# Patient Record
Sex: Male | Born: 1955 | Race: Black or African American | Hispanic: No | Marital: Single | Smoking: Former smoker
Health system: Southern US, Community
[De-identification: ages and names within clinical notes are randomized; demographics above are authoritative.]

---

## 1997-12-17 ENCOUNTER — Ambulatory Visit (HOSPITAL_COMMUNITY): Admission: RE | Admit: 1997-12-17 | Discharge: 1997-12-17 | Payer: Self-pay | Admitting: *Deleted

## 1998-02-11 ENCOUNTER — Ambulatory Visit (HOSPITAL_COMMUNITY): Admission: RE | Admit: 1998-02-11 | Discharge: 1998-02-11 | Payer: Self-pay | Admitting: Neurosurgery

## 1998-03-31 ENCOUNTER — Observation Stay (HOSPITAL_COMMUNITY): Admission: RE | Admit: 1998-03-31 | Discharge: 1998-04-01 | Payer: Self-pay | Admitting: Neurosurgery

## 1998-04-22 ENCOUNTER — Ambulatory Visit (HOSPITAL_COMMUNITY): Admission: RE | Admit: 1998-04-22 | Discharge: 1998-04-22 | Payer: Self-pay | Admitting: Neurosurgery

## 1998-04-22 ENCOUNTER — Encounter: Payer: Self-pay | Admitting: Neurosurgery

## 1998-05-18 ENCOUNTER — Encounter: Payer: Self-pay | Admitting: Neurosurgery

## 1998-05-18 ENCOUNTER — Ambulatory Visit (HOSPITAL_COMMUNITY): Admission: RE | Admit: 1998-05-18 | Discharge: 1998-05-18 | Payer: Self-pay | Admitting: Neurosurgery

## 2015-02-10 ENCOUNTER — Inpatient Hospital Stay
Admission: RE | Admit: 2015-02-10 | Discharge: 2015-03-07 | Disposition: A | Discharge status: Home Health | Payer: Medicare HMO | Source: Ambulatory Visit | Attending: Internal Medicine | Admitting: Internal Medicine

## 2015-02-10 DIAGNOSIS — J939 Pneumothorax, unspecified: Secondary | ICD-10-CM

## 2015-02-10 DIAGNOSIS — J942 Hemothorax: Secondary | ICD-10-CM

## 2015-02-10 DIAGNOSIS — J9690 Respiratory failure, unspecified, unspecified whether with hypoxia or hypercapnia: Secondary | ICD-10-CM

## 2015-02-10 DIAGNOSIS — R0602 Shortness of breath: Secondary | ICD-10-CM

## 2015-02-10 DIAGNOSIS — J90 Pleural effusion, not elsewhere classified: Secondary | ICD-10-CM

## 2015-02-10 DIAGNOSIS — Z4682 Encounter for fitting and adjustment of non-vascular catheter: Secondary | ICD-10-CM

## 2015-02-10 DIAGNOSIS — Z9689 Presence of other specified functional implants: Secondary | ICD-10-CM

## 2015-02-11 ENCOUNTER — Other Ambulatory Visit (HOSPITAL_COMMUNITY): Payer: Self-pay

## 2015-02-11 LAB — COMPREHENSIVE METABOLIC PANEL
ALBUMIN: 2.5 g/dL — AB (ref 3.5–5.0)
ALK PHOS: 99 U/L (ref 38–126)
ALT: 19 U/L (ref 17–63)
ANION GAP: 7 (ref 5–15)
AST: 24 U/L (ref 15–41)
BUN: 7 mg/dL (ref 6–20)
CO2: 25 mmol/L (ref 22–32)
CREATININE: 0.81 mg/dL (ref 0.61–1.24)
Calcium: 9.3 mg/dL (ref 8.9–10.3)
Chloride: 102 mmol/L (ref 101–111)
GFR calc non Af Amer: >60 mL/min (ref >60)
GLUCOSE: 104 mg/dL — AB (ref 65–99)
Potassium: 3.8 mmol/L (ref 3.5–5.1)
SODIUM: 134 mmol/L — AB (ref 135–145)
Total Bilirubin: 0.7 mg/dL (ref 0.3–1.2)
Total Protein: 7.1 g/dL (ref 6.5–8.1)

## 2015-02-11 LAB — CBC
HEMATOCRIT: 38.8 % — AB (ref 39.0–52.0)
HEMOGLOBIN: 12.9 g/dL — AB (ref 13.0–17.0)
MCH: 30.6 pg (ref 26.0–34.0)
MCHC: 33.2 g/dL (ref 30.0–36.0)
MCV: 91.9 fL (ref 78.0–100.0)
PLATELETS: 193 10*3/uL (ref 150–400)
RBC: 4.22 MIL/uL (ref 4.22–5.81)
RDW: 12.8 % (ref 11.5–15.5)
WBC: 5.2 10*3/uL (ref 4.0–10.5)

## 2015-02-11 LAB — TSH: TSH: 1.342 u[IU]/mL (ref 0.350–4.500)

## 2015-02-14 ENCOUNTER — Other Ambulatory Visit (HOSPITAL_COMMUNITY): Payer: Self-pay

## 2015-02-14 LAB — CBC
HCT: 39.3 % (ref 39.0–52.0)
Hemoglobin: 13.1 g/dL (ref 13.0–17.0)
MCH: 30.7 pg (ref 26.0–34.0)
MCHC: 33.3 g/dL (ref 30.0–36.0)
MCV: 92 fL (ref 78.0–100.0)
PLATELETS: 185 10*3/uL (ref 150–400)
RBC: 4.27 MIL/uL (ref 4.22–5.81)
RDW: 12.8 % (ref 11.5–15.5)
WBC: 3.6 10*3/uL — ABNORMAL LOW (ref 4.0–10.5)

## 2015-02-14 LAB — BASIC METABOLIC PANEL
ANION GAP: 5 (ref 5–15)
BUN: 10 mg/dL (ref 6–20)
CHLORIDE: 106 mmol/L (ref 101–111)
CO2: 23 mmol/L (ref 22–32)
Calcium: 9.1 mg/dL (ref 8.9–10.3)
Creatinine, Ser: 0.74 mg/dL (ref 0.61–1.24)
GFR calc Af Amer: >60 mL/min (ref >60)
GFR calc non Af Amer: >60 mL/min (ref >60)
Glucose, Bld: 106 mg/dL — ABNORMAL HIGH (ref 65–99)
Potassium: 3.8 mmol/L (ref 3.5–5.1)
Sodium: 134 mmol/L — ABNORMAL LOW (ref 135–145)

## 2015-02-16 DIAGNOSIS — R0902 Hypoxemia: Secondary | ICD-10-CM

## 2015-02-16 DIAGNOSIS — J441 Chronic obstructive pulmonary disease with (acute) exacerbation: Secondary | ICD-10-CM | POA: Diagnosis not present

## 2015-02-16 DIAGNOSIS — Z789 Other specified health status: Secondary | ICD-10-CM

## 2015-02-16 NOTE — Consult Note (Signed)
Name: Glen Estrada MRN: [id number] DOB: 04-15-1956    ADMISSION DATE:  02/10/2015 CONSULTATION DATE:  7/27  REFERRING MD :  Sharyon Medicus  CHIEF COMPLAINT:  Pneumothorax   BRIEF PATIENT DESCRIPTION:   This is a 59 year old patient [facility] after being discharged from outside hospital where he was admitted for left-sided rib fractures and traumatic pneumothorax following a fall while intoxicated. This was treated initially with chest tube insertion by thoracic surgery. One chest tube was removed, however his anterior chest tube remained due to ongoing air leak. He was deemed to be not a surgical candidate.[facility] management of pneumothorax.  SIGNIFICANT EVENTS    STUDIES:     HISTORY OF PRESENT ILLNESS:     PAST MEDICAL HISTORY :  Alcohol abuse, severe chronic obstructive pulmonary disease, facial fractures following a fall, hypertension  Allergies not on file  FAMILY HISTORY:  family history is not on file. SOCIAL HISTORY:  lives in [place]. Positive history of EtOH and smoking  REVIEW OF SYSTEMS:   Constitutional: Negative for fever, chills, weight loss, malaise/fatigue and diaphoresis.  HENT: Negative for hearing loss, ear pain, nosebleeds, congestion, sore throat, neck pain, tinnitus and ear discharge.   Eyes: Negative for blurred vision, double vision, photophobia, pain, discharge and redness.  Respiratory: Negative for cough, hemoptysis, sputum production, he does have shortness of breath, mostly associated with pain, this was resolved once chest tube was placed to suction wheezing and stridor.   Cardiovascular: Negative for chest pain, palpitations, orthopnea, claudication, leg swelling and PND.  Gastrointestinal: Negative for heartburn, nausea, vomiting, abdominal pain, diarrhea, constipation, blood in stool and melena.  Genitourinary: Negative for dysuria, urgency, frequency, hematuria and flank  pain.  Musculoskeletal: Negative for myalgias, back pain, joint pain and falls.  Skin: Negative for itching and rash.  Neurological: Negative for dizziness, tingling, tremors, sensory change, speech change, focal weakness, seizures, loss of consciousness, weakness and headaches.  Endo/Heme/Allergies: Negative for environmental allergies and polydipsia. Does not bruise/bleed easily.  SUBJECTIVE:  No acute distress VITAL SIGNS: 98.7, heart rate 78 respirations 20 blood pressure 140/82 saturations and 99%room-air PHYSICAL EXAMINATION: General:  59 year old male patient currently sitting up in bed in no acute distress Neuro:  Awake alert no focal deficits HEENT:  Normal cephalic atraumatic no JVD Cardiovascular:  Heart regular rate and rhythm Lungs:  Left greater than right rhonchi. Left chest tube with transient 2/6 airleak; after application of 20 cm H2O suction Abdomen:  Soft nontender Musculoskeletal: intact Skin:  intact   Recent Labs Lab 02/11/15 0538 02/14/15 0438  NA 134* 134*  K 3.8 3.8  CL 102 106  CO2 25 23  BUN 7 10  CREATININE 0.81 0.74  GLUCOSE 104* 106*    Recent Labs Lab 02/11/15 0538 02/14/15 0438  HGB 12.9* 13.1  HCT 38.8* 39.3  WBC 5.2 3.6*  PLT 193 185   No results found.  ASSESSMENT / PLAN:  Traumatic left-sided pneumothorax With associated left-sided chest pain COPD  Discussion He has significant underlying chronic obstructive pulmonary disease and slow to resolve pneumothorax. Has been on water seal with persistent pneumothorax. Patient now placed to 20 cm suction, observed transient 2/6 air leak, with resolution of chest discomfort following application of suction.  Plan Continue current chest tube at 20 cm suction Followup chest x-ray 7/28, if no pneumothorax on chest x-ray, and also no airleak will then repeat attempt waterseal to assure resolution of  pneumothorax.  Simonne Martinet ACNP-BC [facility] Pulmonary/Critical Care Pager #  [phone] OR # [phone] if no answer'  Attending note:  59 year old male with history of COPD s/p a traumatic PTX who presents to [facility] for rehab and chest tube management.  C/O CP.  Decreased BS on the right on exam prior placement of CT to suction.  Improved with 20 cm of water suction.  I reviewed the CXR myself and chest tube is in proper position.  Discussed with [facility] and PCCM-NP.  PTX:  - Place chest tube on suction.  - CXR in AM.  - If CXR is improved may place back to water seal assuming no recurrence of the PTX.  - If 7/29 CXR normal while on water seal may remove chest tube.  COPD:  - BD as ordered.  - Smoking cessation.  Hypoxemia:  - Supplemental O2 as needed.  - Will need an ambulatory desaturation study prior to discharge to determine if will need home O2.  - Titrate O2 for sat of 88-92%.  Chest Pain: due to PTX.  - Chest tube management as above.  Patient seen and examined, agree with above note.  I dictated the care and orders written for this patient under my direction.  Alyson Reedy, MD [phone]  02/16/2015, 12:29 PM

## 2015-02-17 ENCOUNTER — Other Ambulatory Visit (HOSPITAL_COMMUNITY): Payer: Self-pay

## 2015-02-17 DIAGNOSIS — J942 Hemothorax: Secondary | ICD-10-CM | POA: Diagnosis not present

## 2015-02-17 DIAGNOSIS — Z4682 Encounter for fitting and adjustment of non-vascular catheter: Secondary | ICD-10-CM | POA: Diagnosis not present

## 2015-02-17 DIAGNOSIS — J939 Pneumothorax, unspecified: Secondary | ICD-10-CM | POA: Diagnosis not present

## 2015-02-17 DIAGNOSIS — J9600 Acute respiratory failure, unspecified whether with hypoxia or hypercapnia: Secondary | ICD-10-CM | POA: Diagnosis not present

## 2015-02-17 DIAGNOSIS — J9690 Respiratory failure, unspecified, unspecified whether with hypoxia or hypercapnia: Secondary | ICD-10-CM | POA: Insufficient documentation

## 2015-02-17 NOTE — Progress Notes (Signed)
Name: Glen Estrada MRN: [id number] DOB: [birth year]    ADMISSION DATE:  02/10/2015 CONSULTATION DATE:  7/27  REFERRING MD :  Sharyon Medicus  CHIEF COMPLAINT:  Pneumothorax   BRIEF PATIENT DESCRIPTION:   This is a 59 year old patient was [facility] after being discharged from outside hospital where he was admitted for left-sided rib fractures and traumatic pneumothorax following a fall while intoxicated. This was treated initially with chest tube insertion by thoracic surgery. One chest tube was removed, however his anterior chest tube remained due to ongoing air leak. He was deemed to be not a surgical candidate.[facility] of pneumothorax.  SIGNIFICANT EVENTS    STUDIES:    SUBJECTIVE:  No distress  VITAL SIGNS: 98.2 hr 74 rr 18 bp 130/78 stas 95% room air   PHYSICAL EXAMINATION: General:  59 year old male patient currently sitting up in bed in no acute distress Neuro:  Awake alert no focal deficits HEENT:  Normal cephalic atraumatic no JVD Cardiovascular:  Heart regular rate and rhythm Lungs:  Left greater than right rhonchi. Left chest tube with no airleak on 20 cm sxn; slowly improving Jessamine air over right chest remains Abdomen:  Soft nontender Musculoskeletal: intact Skin:  intact   Recent Labs Lab 02/11/15 0538 02/14/15 0438  NA 134* 134*  K 3.8 3.8  CL 102 106  CO2 25 23  BUN 7 10  CREATININE 0.81 0.74  GLUCOSE 104* 106*    Recent Labs Lab 02/11/15 0538 02/14/15 0438  HGB 12.9* 13.1  HCT 38.8* 39.3  WBC 5.2 3.6*  PLT 193 185   Dg Chest Port 1 View  02/17/2015   CLINICAL DATA:  Recent pneumothorax with chest tube placement  EXAM: PORTABLE CHEST - 1 VIEW  COMPARISON:  February 14, 2015  FINDINGS: Chest tube position on the left is unchanged. Left-sided pneumothorax is smaller with only a rather minimal apical component currently. There is subcutaneous air bilaterally. There is again noted  bullous disease in the upper lobes, more on the left than on the right. There is mild bibasilar atelectasis. Lungs elsewhere clear. Heart size is normal. The pulmonary vascularity reflects the underlying emphysematous change. No adenopathy.  IMPRESSION: Chest tube remains on the left with left apical pneumothorax smaller compared to most recent prior study. Bullous disease in the upper lobes, more on the left than on the right, stable. There is mild bibasilar atelectasis. No new opacity. No change in cardiac silhouette.   Electronically Signed   By: Bretta Bang III M.D.   On: 02/17/2015 07:56  still has small apical ptx on review of today's film   ASSESSMENT / PLAN:  Traumatic left-sided pneumothorax With associated left-sided chest pain COPD  Discussion He has significant underlying chronic obstructive pulmonary disease and slow to resolve pneumothorax.  No airleak on 20 cm H2O sxn. Not sure if the remaining small apical ptx is active PTX or simply lung that will not fully expand.   Plan Change to water seal Followup chest x-ray 7/29 if still no airleak and PTX  W/out change will assess for CT removal. I  Simonne Martinet ACNP-BC [facility] Pulmonary/Critical Care Pager # [phone] OR # [phone] if no answer'   STAFF NOTE: I, Rory Percy, MD FACP have personally reviewed patient's available data, including medical history, events of note, physical examination and test results as part of my evaluation. I have discussed with resident/NP and other care providers such as  pharmacist, RN and RRT. In addition, I personally evaluated patient and elicited key findings of: CTA distant, no distress now, does develop pain with PTX, to water seal now as lung seams to not have changed, he seems to have somewhat noncompliant lung dz, we may have a small space that lung does not re expand, would water seal today, pcxr this afternoon, follow symptoms closely   Mcarthur Rossetti. Tyson Alias, MD, FACP Pgr:  [phone] [place] [facility] 02/17/2015 7:36 PM

## 2015-02-18 ENCOUNTER — Other Ambulatory Visit (HOSPITAL_COMMUNITY): Payer: Self-pay

## 2015-02-18 LAB — CBC
HCT: 39.5 % (ref 39.0–52.0)
HEMOGLOBIN: 13.2 g/dL (ref 13.0–17.0)
MCH: 30.6 pg (ref 26.0–34.0)
MCHC: 33.4 g/dL (ref 30.0–36.0)
MCV: 91.4 fL (ref 78.0–100.0)
Platelets: 170 10*3/uL (ref 150–400)
RBC: 4.32 MIL/uL (ref 4.22–5.81)
RDW: 12.5 % (ref 11.5–15.5)
WBC: 6.1 10*3/uL (ref 4.0–10.5)

## 2015-02-18 LAB — BASIC METABOLIC PANEL
Anion gap: 6 (ref 5–15)
BUN: 9 mg/dL (ref 6–20)
CALCIUM: 9.1 mg/dL (ref 8.9–10.3)
CO2: 26 mmol/L (ref 22–32)
Chloride: 101 mmol/L (ref 101–111)
Creatinine, Ser: 0.82 mg/dL (ref 0.61–1.24)
GFR calc non Af Amer: >60 mL/min (ref >60)
Glucose, Bld: 99 mg/dL (ref 65–99)
POTASSIUM: 4 mmol/L (ref 3.5–5.1)
Sodium: 133 mmol/L — ABNORMAL LOW (ref 135–145)

## 2015-02-21 ENCOUNTER — Institutional Professional Consult (permissible substitution) (HOSPITAL_COMMUNITY): Payer: Self-pay

## 2015-02-21 DIAGNOSIS — J939 Pneumothorax, unspecified: Secondary | ICD-10-CM | POA: Diagnosis not present

## 2015-02-21 DIAGNOSIS — J942 Hemothorax: Secondary | ICD-10-CM | POA: Diagnosis not present

## 2015-02-21 NOTE — Progress Notes (Signed)
   Name: Glen Estrada MRN: [id number] DOB: 03/27/1956    ADMISSION DATE:  02/10/2015 CONSULTATION DATE:  7/27  REFERRING MD :  Sharyon Medicus  CHIEF COMPLAINT:  Pneumothorax   BRIEF PATIENT DESCRIPTION:   This is a 59 year old patient was [facility] after being discharged from outside hospital where he was admitted for left-sided rib fractures and traumatic pneumothorax following a fall while intoxicated. This was treated initially with chest tube insertion by thoracic surgery. One chest tube was removed, however his anterior chest tube remained due to ongoing air leak. He was deemed to be not a surgical candidate.[facility] of pneumothorax.  SIGNIFICANT EVENTS    STUDIES:    SUBJECTIVE:  No distress  VITAL SIGNS: Reviewed at bedside, abnormal values addressed in A/P  PHYSICAL EXAMINATION: General:  59 year old male patient currently sitting up in chair in no acute distress Neuro:  Awake alert no focal deficits HEENT:  Normal cephalic atraumatic no JVD Cardiovascular:  Heart regular rate and rhythm Lungs:  Left greater than right rhonchi. Left chest tube with no airleak on water seal; minimal SQ air on chest remains Abdomen:  Soft nontender Musculoskeletal: intact Skin:  intact   Recent Labs Lab 02/18/15 0543  NA 133*  K 4.0  CL 101  CO2 26  BUN 9  CREATININE 0.82  GLUCOSE 99    Recent Labs Lab 02/18/15 0543  HGB 13.2  HCT 39.5  WBC 6.1  PLT 170   No results found.still has small apical ptx on 7/29 film   ASSESSMENT / PLAN:  Traumatic left-sided pneumothorax With associated left-sided chest pain - s/p L chest tube COPD  Discussion He has significant underlying chronic obstructive pulmonary disease and slow to resolve pneumothorax.  No airleak onwater seal but still with small apical ptx on 7/29 CXR. Not sure if the remaining small apical ptx is active PTX or simply lung that will not  fully expand.   Plan Cont CT to water seal  F/u CXR now  If no remaining or unchanged PTX consider d/c chest tube    Dirk Dress, NP 02/21/2015  11:25 AM Pager: [phone] or [phone]  Attending:  I have seen and examined the patient with nurse practitioner/resident and agree with the note above.   He feels well on water seal, no chest pain CXR from Friday reviewed none from today.  Hard to know if the apical pneumothorax is a bleb or not (after reviewing old CT scans0  Plan repeat CXR in AM, if unchanged will remove chest tube and monitor carefully  Heber Raynham, MD [place] [facility] Pager: [phone] Cell: [phone] After 3pm or if no response, call [phone]

## 2015-02-22 ENCOUNTER — Other Ambulatory Visit (HOSPITAL_COMMUNITY): Payer: Self-pay

## 2015-02-22 LAB — PROTIME-INR
INR: 1.35 (ref 0.00–1.49)
PROTHROMBIN TIME: 16.8 s — AB (ref 11.6–15.2)

## 2015-02-22 NOTE — Progress Notes (Signed)
S:  Called to bedside to pull left chest tube. Dr. Laren Everts indicates resistance when attempting to pull remaining L chest tube.  Sutures clipped prior to arrival.    O:  Patient awake/alert, NAD Speech clear, MAE L chest tube in position.  No drainage or airleak from site.  Able to rotate tube almost 360 degrees without resistance.  However, tube will move approx 1 cm but significant resistance met when attempting to pull.  Applied gentle pressure but tube unable to be removed.     Assessment / Plan: CT Chest to evaluate chest tube placement.  Concerned that it has been in so long that there may be pleural adhesions.   Pending CT review, consider CVTS evaluation of chest tube removal.   Check PT/INR, platelets  Noe Gens, NP-C [place] [facility] Pgr: [phone] or if no answer [phone] 02/22/2015, 2:53 PM

## 2015-02-23 DIAGNOSIS — J939 Pneumothorax, unspecified: Secondary | ICD-10-CM

## 2015-02-23 DIAGNOSIS — J942 Hemothorax: Secondary | ICD-10-CM | POA: Diagnosis not present

## 2015-02-23 NOTE — Progress Notes (Signed)
Name: Glen Estrada MRN: [id number] DOB: January 31, 1956    ADMISSION DATE:  02/10/2015 CONSULTATION DATE:  7/27  REFERRING MD :  Laren Everts  CHIEF COMPLAINT:  Pneumothorax   BRIEF PATIENT DESCRIPTION: 59 year old patient [facility] after being discharged from outside hospital where he was admitted for left-sided rib fractures and traumatic pneumothorax following a fall while intoxicated. This was treated initially with chest tube insertion by thoracic surgery. One chest tube was removed, however his anterior chest tube remained due to ongoing air leak. He was deemed to be not a surgical candidate.[facility]orax.  SIGNIFICANT EVENTS  7/21  Admit  8/02  Attempted to remove L chest tube but met significant resistance 8/02  Seen by Dr. Prescott Gum and he will take out in the OR, planned for next week  STUDIES:    SUBJECTIVE:  Pt reports significant pain from chest tube site.  Dr. Prescott Gum attempted removal last PM as well.  Unable to remove.  Plan for OR next week with conscious sedation and local anesthesia.    VITAL SIGNS: Reviewed at bedside, abnormal values addressed in A/P  PHYSICAL EXAMINATION: General:  male patient currently sitting up in chair in no acute distress Neuro:  Awake alert no focal deficits HEENT:  Normal cephalic atraumatic no JVD Cardiovascular:  Heart regular rate and rhythm Lungs:  Left greater than right rhonchi. Left chest tube with no airleak on water seal Abdomen:  Soft nontender Musculoskeletal: intact Skin:  intact   Recent Labs Lab 02/18/15 0543  NA 133*  K 4.0  CL 101  CO2 26  BUN 9  CREATININE 0.82  GLUCOSE 99    Recent Labs Lab 02/18/15 0543  HGB 13.2  HCT 39.5  WBC 6.1  PLT 170   Ct Chest Wo Contrast  02/22/2015   CLINICAL DATA:  Unable to remove left chest tube.  EXAM: CT CHEST WITHOUT CONTRAST  TECHNIQUE: Multidetector CT imaging of the chest  was performed following the standard protocol without IV contrast.  COMPARISON:  Chest x-ray 02/22/2015, chest CT 01/17/2015  FINDINGS: Left-sided chest tube remains in place in the pleural space. There is soft tissue between the chest tube in the chest wall which could be fibrosis. The tube extends into the lung apex adjacent to the left subclavian artery and may be adherent to the pleura.  Negative for pneumothorax.  Mild subcutaneous emphysema bilaterally.  Extensive emphysema with large apical blebs bilaterally left greater than right. No effusion. Right lower lobe airspace disease may represent pneumonia or atelectasis and has progressed since the prior study. Improved aeration left lower lobe compared with the prior study when there was significant infiltrate.  Heart size normal.  Severe cirrhosis of the liver without ascites in the upper abdomen.  IMPRESSION: Left chest tube extends to the left apex adjacent to the left subclavian artery. The tube is within the pleural space but may be adherent to the pleura. No pneumothorax  Severe emphysema and apical bullae.  Right lower lobe atelectasis/ infiltrate.  No effusion.  Advanced cirrhosis of the liver.   Electronically Signed   By: Franchot Gallo M.D.   On: 02/22/2015 16:35   Dg Chest Port 1 View  02/22/2015   CLINICAL DATA:  Left-sided chest tube.  EXAM: PORTABLE CHEST - 1 VIEW  COMPARISON:  One-view chest 02/18/2015.  FINDINGS: The heart size is normal. Bullous changes are again noted in the  left upper lobe. A left-sided chest tube is in place. No definite pneumothorax is evident. Mid aeration of the left mid lung is improved. Mild ill-defined airspace disease at the right base persists. Right-sided subcutaneous emphysema is again noted. The visualized soft tissues and bony thorax are unremarkable.  IMPRESSION: 1. Stable left-sided chest tube without definite pneumothorax. 2. Bullous changes in the left lung are again noted. 3. Improved aeration in the left  mid lung. 4. Persistent ill-defined airspace disease at the right base. 5. Persistent subcutaneous emphysema on the right.   Electronically Signed   By: San Morelle M.D.   On: 02/22/2015 07:15    ASSESSMENT / PLAN:  Traumatic left-sided pneumothorax with associated left-sided chest pain - s/p L chest tube on June 25th at [facility] COPD  Discussion He has significant underlying chronic obstructive pulmonary disease and slow to resolve pneumothorax.  No airleak on water seal.  Attempts to pull chest tube have been unsuccessful.   Plan Cont CT to water seal  Intermittent CXR Seen by Dr. Prescott Gum, he will remove CT under conscious sedation / local anesthesia iin the OR Monitor CT site, continue dressing / care as needed.     Noe Gens, NP-C [place] [facility] Pgr: [phone] or if no answer [phone] 02/23/2015, 12:30 PM

## 2015-02-23 NOTE — Progress Notes (Signed)
CT surgery evaluation  Patient examined and chest x-ray, CT scan of chest reviewed Left chest tube placed June 25 at [facility] is ready to be removed with resolution of pneumothorax and air leak Previous attempts at removing the tube were unsuccessful It appears the large tube is pinched between the ribs I was unable pull the tube against resistance without significant pain by the patient The patient will need to have the tube removed in the OR with some IV conscious sedation and local anesthesia. I am  away on vacation until next week. If no one else is available I will perform the procedure  At that time.

## 2015-02-24 LAB — CBC WITH DIFFERENTIAL/PLATELET
Basophils Absolute: 0 10*3/uL (ref 0.0–0.1)
Basophils Relative: 0 % (ref 0–1)
EOS ABS: 0.2 10*3/uL (ref 0.0–0.7)
Eosinophils Relative: 2 % (ref 0–5)
HCT: 38.8 % — ABNORMAL LOW (ref 39.0–52.0)
Hemoglobin: 12.9 g/dL — ABNORMAL LOW (ref 13.0–17.0)
LYMPHS ABS: 1.3 10*3/uL (ref 0.7–4.0)
LYMPHS PCT: 12 % (ref 12–46)
MCH: 30.1 pg (ref 26.0–34.0)
MCHC: 33.2 g/dL (ref 30.0–36.0)
MCV: 90.4 fL (ref 78.0–100.0)
MONOS PCT: 18 % — AB (ref 3–12)
Monocytes Absolute: 1.9 10*3/uL — ABNORMAL HIGH (ref 0.1–1.0)
Neutro Abs: 7.2 10*3/uL (ref 1.7–7.7)
Neutrophils Relative %: 68 % (ref 43–77)
Platelets: 203 10*3/uL (ref 150–400)
RBC: 4.29 MIL/uL (ref 4.22–5.81)
RDW: 13.3 % (ref 11.5–15.5)
WBC: 10.6 10*3/uL — ABNORMAL HIGH (ref 4.0–10.5)

## 2015-02-24 LAB — COMPREHENSIVE METABOLIC PANEL
ALBUMIN: 2.5 g/dL — AB (ref 3.5–5.0)
ALT: 21 U/L (ref 17–63)
AST: 30 U/L (ref 15–41)
Alkaline Phosphatase: 111 U/L (ref 38–126)
Anion gap: 7 (ref 5–15)
BILIRUBIN TOTAL: 1.1 mg/dL (ref 0.3–1.2)
BUN: 11 mg/dL (ref 6–20)
CALCIUM: 9 mg/dL (ref 8.9–10.3)
CO2: 23 mmol/L (ref 22–32)
CREATININE: 0.7 mg/dL (ref 0.61–1.24)
Chloride: 102 mmol/L (ref 101–111)
GFR calc Af Amer: >60 mL/min (ref >60)
GFR calc non Af Amer: >60 mL/min (ref >60)
Glucose, Bld: 104 mg/dL — ABNORMAL HIGH (ref 65–99)
Potassium: 4.2 mmol/L (ref 3.5–5.1)
SODIUM: 132 mmol/L — AB (ref 135–145)
TOTAL PROTEIN: 7.3 g/dL (ref 6.5–8.1)

## 2015-02-24 LAB — PHOSPHORUS: Phosphorus: 4 mg/dL (ref 2.5–4.6)

## 2015-02-24 LAB — MAGNESIUM: MAGNESIUM: 1.5 mg/dL — AB (ref 1.7–2.4)

## 2015-02-26 LAB — BASIC METABOLIC PANEL
ANION GAP: 6 (ref 5–15)
BUN: 12 mg/dL (ref 6–20)
CALCIUM: 9 mg/dL (ref 8.9–10.3)
CHLORIDE: 104 mmol/L (ref 101–111)
CO2: 24 mmol/L (ref 22–32)
CREATININE: 0.61 mg/dL (ref 0.61–1.24)
GLUCOSE: 101 mg/dL — AB (ref 65–99)
Potassium: 3.6 mmol/L (ref 3.5–5.1)
Sodium: 134 mmol/L — ABNORMAL LOW (ref 135–145)

## 2015-02-26 LAB — MAGNESIUM: Magnesium: 1.5 mg/dL — ABNORMAL LOW (ref 1.7–2.4)

## 2015-02-28 ENCOUNTER — Other Ambulatory Visit (HOSPITAL_COMMUNITY): Payer: Self-pay

## 2015-03-03 LAB — BASIC METABOLIC PANEL
Anion gap: 10 (ref 5–15)
BUN: 8 mg/dL (ref 6–20)
CO2: 23 mmol/L (ref 22–32)
Calcium: 9.4 mg/dL (ref 8.9–10.3)
Chloride: 100 mmol/L — ABNORMAL LOW (ref 101–111)
Creatinine, Ser: 0.72 mg/dL (ref 0.61–1.24)
GFR calc Af Amer: >60 mL/min (ref >60)
GLUCOSE: 102 mg/dL — AB (ref 65–99)
Potassium: 4 mmol/L (ref 3.5–5.1)
Sodium: 133 mmol/L — ABNORMAL LOW (ref 135–145)

## 2015-03-03 LAB — MAGNESIUM: Magnesium: 1.7 mg/dL (ref 1.7–2.4)

## 2015-03-04 ENCOUNTER — Other Ambulatory Visit (HOSPITAL_COMMUNITY): Payer: Self-pay

## 2015-03-04 ENCOUNTER — Encounter (HOSPITAL_COMMUNITY): Payer: Self-pay | Admitting: Anesthesiology

## 2015-03-04 ENCOUNTER — Encounter
Admission: RE | Disposition: A | Discharge status: Home Health | Payer: Self-pay | Source: Ambulatory Visit | Attending: Internal Medicine

## 2015-03-04 ENCOUNTER — Institutional Professional Consult (permissible substitution) (HOSPITAL_COMMUNITY): Payer: Self-pay | Admitting: Anesthesiology

## 2015-03-04 DIAGNOSIS — J9383 Other pneumothorax: Secondary | ICD-10-CM

## 2015-03-04 HISTORY — PX: CHEST TUBE INSERTION: SHX231

## 2015-03-04 SURGERY — CHEST TUBE INSERTION
Anesthesia: Monitor Anesthesia Care | Site: Chest | Laterality: Left

## 2015-03-04 MED ORDER — PROPOFOL 10 MG/ML IV BOLUS
INTRAVENOUS | Status: DC | PRN
  Administered 2015-03-04: 20 mg via INTRAVENOUS
  Administered 2015-03-04 (×3): 40 mg via INTRAVENOUS

## 2015-03-04 MED ORDER — FENTANYL CITRATE (PF) 250 MCG/5ML IJ SOLN
INTRAMUSCULAR | Status: AC
  Filled 2015-03-04: qty 5

## 2015-03-04 MED ORDER — CEFAZOLIN SODIUM-DEXTROSE 2-3 GM-% IV SOLR
INTRAVENOUS | Status: AC
  Filled 2015-03-04: qty 50

## 2015-03-04 MED ORDER — ACETAMINOPHEN 650 MG RE SUPP: 650.0000 mg | RECTAL | Status: DC | PRN

## 2015-03-04 MED ORDER — ONDANSETRON HCL 4 MG/2ML IJ SOLN
INTRAMUSCULAR | Status: AC
  Filled 2015-03-04: qty 2

## 2015-03-04 MED ORDER — KETOROLAC TROMETHAMINE 15 MG/ML IJ SOLN: 15.0000 mg | Freq: Four times a day (QID) | INTRAMUSCULAR | Status: DC

## 2015-03-04 MED ORDER — LIDOCAINE HCL 1 % IJ SOLN
INTRAMUSCULAR | Status: DC | PRN
  Administered 2015-03-04: 10 mL

## 2015-03-04 MED ORDER — LIDOCAINE HCL (CARDIAC) 20 MG/ML IV SOLN
INTRAVENOUS | Status: AC
  Filled 2015-03-04: qty 10

## 2015-03-04 MED ORDER — MIDAZOLAM HCL 2 MG/2ML IJ SOLN
INTRAMUSCULAR | Status: AC
  Filled 2015-03-04: qty 4

## 2015-03-04 MED ORDER — ROCURONIUM BROMIDE 50 MG/5ML IV SOLN
INTRAVENOUS | Status: AC
  Filled 2015-03-04: qty 1

## 2015-03-04 MED ORDER — SUCCINYLCHOLINE CHLORIDE 20 MG/ML IJ SOLN
INTRAMUSCULAR | Status: AC
  Filled 2015-03-04: qty 1

## 2015-03-04 MED ORDER — SODIUM CHLORIDE 0.9 % IJ SOLN: 3.0000 mL | INTRAMUSCULAR | Status: DC | PRN

## 2015-03-04 MED ORDER — SODIUM CHLORIDE 0.9 % IJ SOLN
INTRAMUSCULAR | Status: AC
  Filled 2015-03-04: qty 10

## 2015-03-04 MED ORDER — SODIUM CHLORIDE 0.9 % IJ SOLN: 3.0000 mL | Freq: Two times a day (BID) | INTRAMUSCULAR | Status: DC

## 2015-03-04 MED ORDER — CEFAZOLIN SODIUM-DEXTROSE 2-3 GM-% IV SOLR
INTRAVENOUS | Status: DC | PRN
  Administered 2015-03-04: 2 g via INTRAVENOUS

## 2015-03-04 MED ORDER — SODIUM CHLORIDE 0.9 % IV SOLN: 250.0000 mL | INTRAVENOUS | Status: DC | PRN

## 2015-03-04 MED ORDER — LIDOCAINE HCL (PF) 1 % IJ SOLN
INTRAMUSCULAR | Status: AC
  Filled 2015-03-04: qty 30

## 2015-03-04 MED ORDER — ACETAMINOPHEN 325 MG PO TABS: 650.0000 mg | ORAL_TABLET | ORAL | Status: DC | PRN

## 2015-03-04 MED ORDER — MIDAZOLAM HCL 5 MG/5ML IJ SOLN
INTRAMUSCULAR | Status: DC | PRN
  Administered 2015-03-04: 2 mg via INTRAVENOUS

## 2015-03-04 MED ORDER — ARTIFICIAL TEARS OP OINT
TOPICAL_OINTMENT | OPHTHALMIC | Status: AC
  Filled 2015-03-04: qty 3.5

## 2015-03-04 MED ORDER — EPHEDRINE SULFATE 50 MG/ML IJ SOLN
INTRAMUSCULAR | Status: AC
  Filled 2015-03-04: qty 1

## 2015-03-04 MED ORDER — LACTATED RINGERS IV SOLN
INTRAVENOUS | Status: DC | PRN
  Administered 2015-03-04: 07:00:00 via INTRAVENOUS

## 2015-03-04 MED ORDER — FENTANYL CITRATE (PF) 100 MCG/2ML IJ SOLN
INTRAMUSCULAR | Status: DC | PRN
  Administered 2015-03-04 (×2): 50 ug via INTRAVENOUS

## 2015-03-04 MED ORDER — FENTANYL CITRATE (PF) 100 MCG/2ML IJ SOLN: 25.0000 ug | INTRAMUSCULAR | Status: DC | PRN

## 2015-03-04 MED ORDER — PROPOFOL 10 MG/ML IV BOLUS
INTRAVENOUS | Status: AC
  Filled 2015-03-04: qty 20

## 2015-03-04 MED ORDER — OXYCODONE HCL 5 MG PO TABS: 5.0000 mg | ORAL_TABLET | ORAL | Status: DC | PRN

## 2015-03-04 SURGICAL SUPPLY — 32 items
BAG DECANTER FOR FLEXI CONT (MISCELLANEOUS) ×2 IMPLANT
BLADE SURG 10 STRL SS (BLADE) ×2 IMPLANT
BLADE SURG 11 STRL SS (BLADE) ×2 IMPLANT
CANISTER SUCT 3000ML PPV (MISCELLANEOUS) ×3 IMPLANT
CATH THORACIC 28FR (CATHETERS) IMPLANT
CATH THORACIC 36FR (CATHETERS) IMPLANT
COVER SURGICAL LIGHT HANDLE (MISCELLANEOUS) ×3 IMPLANT
COVER TABLE BACK 60X90 (DRAPES) ×3 IMPLANT
DRAPE CHEST BREAST 15X10 FENES (DRAPES) ×3 IMPLANT
GAUZE SPONGE 4X4 12PLY STRL (GAUZE/BANDAGES/DRESSINGS) ×3 IMPLANT
GAUZE SPONGE 4X4 16PLY XRAY LF (GAUZE/BANDAGES/DRESSINGS) ×3 IMPLANT
GAUZE XEROFORM 1X8 LF (GAUZE/BANDAGES/DRESSINGS) ×2 IMPLANT
GLOVE BIO SURGEON STRL SZ7.5 (GLOVE) ×3 IMPLANT
GOWN STRL REUS W/ TWL LRG LVL3 (GOWN DISPOSABLE) ×2 IMPLANT
GOWN STRL REUS W/TWL LRG LVL3 (GOWN DISPOSABLE) ×6
KIT BASIN OR (CUSTOM PROCEDURE TRAY) ×3 IMPLANT
KIT ROOM TURNOVER OR (KITS) ×3 IMPLANT
PAD ARMBOARD 7.5X6 YLW CONV (MISCELLANEOUS) ×6 IMPLANT
SPONGE GAUZE 4X4 12PLY STER LF (GAUZE/BANDAGES/DRESSINGS) ×2 IMPLANT
SUT ETHILON 3 0 PS 1 (SUTURE) ×2 IMPLANT
SUT SILK  1 MH (SUTURE)
SUT SILK 1 MH (SUTURE) IMPLANT
SUT VIC AB 3-0 SH 8-18 (SUTURE) ×2 IMPLANT
SYR BULB IRRIGATION 50ML (SYRINGE) ×2 IMPLANT
SYR CONTROL 10ML LL (SYRINGE) ×3 IMPLANT
SYSTEM SAHARA CHEST DRAIN RE-I (WOUND CARE) ×3 IMPLANT
TAPE CLOTH SURG 6X10 WHT LF (GAUZE/BANDAGES/DRESSINGS) ×2 IMPLANT
TOWEL OR 17X26 10 PK STRL BLUE (TOWEL DISPOSABLE) ×3 IMPLANT
TRAP SPECIMEN MUCOUS 40CC (MISCELLANEOUS) ×3 IMPLANT
TRAY CHEST TUBE INSERTION (SET/KITS/TRAYS/PACK) ×3 IMPLANT
TUBE CONNECTING 12'X1/4 (SUCTIONS) ×1
TUBE CONNECTING 12X1/4 (SUCTIONS) ×2 IMPLANT

## 2015-03-04 NOTE — Anesthesia Postprocedure Evaluation (Signed)
  Anesthesia Post-op Note  Patient: Glen Estrada  Procedure(s) Performed: Procedure(s): CHEST TUBE REMOVAL (Left)  Patient Location: PACU  Anesthesia Type:MAC  Level of Consciousness: awake, alert , oriented and patient cooperative  Airway and Oxygen Therapy: Patient Spontanous Breathing  Post-op Pain: none  Post-op Assessment: Post-op Vital signs reviewed, Patient's Cardiovascular Status Stable, Respiratory Function Stable, Patent Airway, No signs of Nausea or vomiting and Pain level controlled              Post-op Vital Signs: Reviewed and stable  Last Vitals:  Filed Vitals:   03/04/15 0834  BP: 140/84  Pulse: 76  Temp: 36.9 C  Resp: 19    Complications: No apparent anesthesia complications

## 2015-03-04 NOTE — Brief Op Note (Signed)
02/10/2015 - 03/04/2015  8:28 AM  PATIENT:  Glen Estrada  59 y.o. male  PRE-OPERATIVE DIAGNOSIS:  Pneumopthorax with airleak  POST-OPERATIVE DIAGNOSIS:  Pneumopthorax with airleak  PROCEDURE:  Procedure(s): CHEST TUBE REMOVAL (Left)  SURGEON:  Surgeon(s) and Role:    * Kerin Perna, MD - Primary  PHYSICIAN ASSISTANT:   ASSISTANTS: none   ANESTHESIA:   local, IV sedation and MAC  EBL:  Total I/O In: 700 [I.V.:700] Out: 3 [Blood:3]  BLOOD ADMINISTERED:none  DRAINS: none   LOCAL MEDICATIONS USED:  LIDOCAINE  and Amount: 10 ml  SPECIMEN:  No Specimen  DISPOSITION OF SPECIMEN:  N/A  COUNTS:  YES  TOURNIQUET:  * No tourniquets in log *  DICTATION: .Dragon Dictation  PLAN OF CARE: [facility] to [facility]  PATIENT DISPOSITION:  PACU - hemodynamically stable.   Delay start of Pharmacological VTE agent (>24hrs) due to surgical blood loss or risk of bleeding: yes

## 2015-03-04 NOTE — Anesthesia Preprocedure Evaluation (Addendum)
Anesthesia Evaluation  Patient identified by MRN, date of birth, ID band Patient awake    Reviewed: Allergy & Precautions, H&P , NPO status , Patient's Chart, lab work & pertinent test results  History of Anesthesia Complications Negative for: history of anesthetic complications  Airway Mallampati: I  TM Distance: >3 FB Neck ROM: full    Dental  (+) Edentulous Upper, Edentulous Lower   Pulmonary COPD COPD inhaler, Current Smoker,  ptx/bleb breath sounds clear to auscultation        Cardiovascular - anginanegative cardio ROS  Rhythm:regular Rate:Normal     Neuro/Psych negative neurological ROS     GI/Hepatic negative GI ROS, (+)     substance abuse  alcohol use,   Endo/Other  negative endocrine ROS  Renal/GU negative Renal ROS     Musculoskeletal   Abdominal   Peds  Hematology   Anesthesia Other Findings Heavy ETOH history.  No alcohol last 30 days since being admitted in hospital.  Chronic brinchitis  Reproductive/Obstetrics                            Anesthesia Physical Anesthesia Plan  ASA: III  Anesthesia Plan: MAC   Post-op Pain Management:    Induction: Intravenous  Airway Management Planned: Natural Airway and Simple Face Mask  Additional Equipment:   Intra-op Plan:   Post-operative Plan:   Informed Consent: I have reviewed the patients History and Physical, chart, labs and discussed the procedure including the risks, benefits and alternatives for the proposed anesthesia with the patient or authorized representative who has indicated his/her understanding and acceptance.   Dental Advisory Given  Plan Discussed with: Anesthesiologist, CRNA and Surgeon  Anesthesia Plan Comments: (Plan routine monitors, MAC)       Anesthesia Quick Evaluation

## 2015-03-04 NOTE — Anesthesia Procedure Notes (Signed)
Procedure Name: MAC Date/Time: 03/04/2015 7:30 AM Performed by: Carmela Rima Pre-anesthesia Checklist: Patient identified, Emergency Drugs available, Suction available, Patient being monitored and Timeout performed Patient Re-evaluated:Patient Re-evaluated prior to inductionOxygen Delivery Method: Simple face mask Placement Confirmation: positive ETCO2 Dental Injury: Teeth and Oropharynx as per pre-operative assessment

## 2015-03-04 NOTE — Transfer of Care (Signed)
Immediate Anesthesia Transfer of Care Note  Patient: Glen Estrada  Procedure(s) Performed: Procedure(s): CHEST TUBE REMOVAL (Left)  Patient Location: PACU  Anesthesia Type:MAC  Level of Consciousness: awake, alert  and oriented  Airway & Oxygen Therapy: Patient Spontanous Breathing  Post-op Assessment: Report given to RN, Post -op Vital signs reviewed and stable and Patient moving all extremities X 4  Post vital signs: Reviewed and stable  Last Vitals: There were no vitals filed for this visit.  Complications: No apparent anesthesia complications

## 2015-03-04 NOTE — Progress Notes (Signed)
The patient was examined and preop studies reviewed. There has been no change from the prior exam and the patient is ready for surgery  plan removal of  Right chest tube today

## 2015-03-05 NOTE — Op Note (Signed)
NAMEREY, DANSBY NO.:  [id number]  MEDICAL RECORD NO.:  [id number]  LOCATION:  [facility]                         FACILITY:  [facility]  PHYSICIAN:  Kerin Perna, M.D.  DATE OF BIRTH:  [birth year]  DATE OF PROCEDURE:  03/04/2015 DATE OF DISCHARGE:                              OPERATIVE REPORT   OPERATION:  Removal of left chest tube.  SURGEON:  Kerin Perna, M.D.  PREOPERATIVE DIAGNOSIS:  Adherent left chest tube to chest wall.  POSTOPERATIVE DIAGNOSIS:  Adherent left chest tube to chest wall.  ANESTHESIA:  Local 1% lidocaine with IV conscious monitored sedation by anesthesia team.  CLINICAL NOTE:  The patient had a chest tube placed at an outside hospital for a spontaneous pneumothorax in late June 2016.  He had a long medical hospitalization secondary to severe underlying COPD and required mechanical ventilation.  He was transferred to the long-term acute care unit with a chest tube in place to water seal.  There was no evidence of an air leak.  Chest CT scan showed no evidence of a pneumothorax.  An attempt to remove the chest tube at the bedside was unsuccessful due to adhesions and extreme pain.  I recommended removing chest tube with anesthesia in the operating room to the patient and he understood and agreed to proceed.  He understood there is a risk of recurrent pneumothorax or bleeding.  DESCRIPTION OF PROCEDURE:  The patient was brought to the operating room and placed supine on the operating table.  The left chest was bumped under a soft blanket.  IV conscious monitored sedation was provided. The chest tube sutures were all removed.  The left chest and chest tube were prepped.  The chest tube was prepped in the field.  The patient draped.  A proper time-out was performed.  Local 1% lidocaine was infiltrated around the insertion site of the chest tube.  The chest tube was clamped and divided distally.  The chest tube tract was dissected using a  small hemostat to break any adhesions in the chest wall connecting the chest tube to the chest wall intercostal space.  After removing these adhesions, the chest tube was mo bile and was pulled without difficulty in its entirety.  The chest tube tract was irrigated with antibiotic irrigation and saline.  The subcutaneous layer was closed with interrupted 3-0 Vicryl. The skin was closed with interrupted 3-0 nylon.  A sterile dressing was applied.  The patient was turned supine and transferred to the recovery room.  A subsequent chest x-ray showed chest tube removal without recurrent pneumothorax.     Kerin Perna, M.D.     PV/MEDQ  D:  03/04/2015  T:  03/05/2015  Job:  [id number]

## 2015-03-06 ENCOUNTER — Other Ambulatory Visit (HOSPITAL_COMMUNITY): Payer: Self-pay

## 2015-03-07 ENCOUNTER — Encounter (HOSPITAL_COMMUNITY): Payer: Self-pay | Admitting: Cardiothoracic Surgery

## 2015-03-08 ENCOUNTER — Telehealth: Payer: Self-pay | Admitting: *Deleted

## 2015-03-08 NOTE — Telephone Encounter (Signed)
Called both phone numbers on file and they were not working numbers. i did mail appointment letter for appointment scheduled on 8/24 @ 11:30am.

## 2015-03-15 ENCOUNTER — Other Ambulatory Visit: Payer: Self-pay | Admitting: Cardiothoracic Surgery

## 2015-03-15 DIAGNOSIS — J939 Pneumothorax, unspecified: Secondary | ICD-10-CM

## 2015-03-16 ENCOUNTER — Encounter: Payer: Self-pay | Admitting: Cardiothoracic Surgery

## 2015-03-16 ENCOUNTER — Telehealth: Payer: Self-pay | Admitting: *Deleted

## 2015-03-16 ENCOUNTER — Ambulatory Visit (INDEPENDENT_AMBULATORY_CARE_PROVIDER_SITE_OTHER): Payer: Medicaid Other | Admitting: Cardiothoracic Surgery

## 2015-03-16 ENCOUNTER — Ambulatory Visit
Admission: RE | Admit: 2015-03-16 | Discharge: 2015-03-16 | Disposition: A | Discharge status: Home / Self Care | Payer: Medicaid Other | Source: Ambulatory Visit | Attending: Cardiothoracic Surgery | Admitting: Cardiothoracic Surgery

## 2015-03-16 VITALS — BP 126/85 | HR 62 | Resp 20 | Ht 70.0 in | Wt 170.0 lb

## 2015-03-16 DIAGNOSIS — J939 Pneumothorax, unspecified: Secondary | ICD-10-CM

## 2015-03-16 NOTE — Telephone Encounter (Signed)
Called patient and patient contact person  In regards to appointment for Dr Donata Clay, no working phone numbers.

## 2015-03-16 NOTE — Progress Notes (Signed)
PCP is No PCP Per Patient Referring Provider is Carron Curie, MD  Chief Complaint  Patient presents with  . Routine Post Op    f/u from surgery with CXR, s/p Removal of left chest tube 03/04/15, patient can not confirm medications    HPI: Postop followup after removal of left chest tube placed at outside hospital. Patient was then transferred to [facility].the chest tube was placed for spontaneous pneumothorax and was in place for over a month. Multiple attempts at emoval of the tube were unsuccessful due to adhesions. I removed the tube in the operating room under conscious sedation. The chest tube site was closed with interrupted suture technique.the patient returns now with chest x-ray showing no pneumothorax. The skin sutures are removed in the office.  No past medical history on file.  Past Surgical History  Procedure Laterality Date  . Chest tube insertion Left 03/04/2015    Procedure: CHEST TUBE REMOVAL;  Surgeon: Kerin Perna, MD;  Location: [facility] OR;  Service: Thoracic;  Laterality: Left;    No family history on file.  Social History Social History  Substance Use Topics  . Smoking status: Former Smoker -- 0.50 packs/day for 35 years    Types: Cigarettes    Quit date: 01/13/2015  . Smokeless tobacco: Never Used  . Alcohol Use: 0.0 oz/week    0 Standard drinks or equivalent per week    Current Outpatient Prescriptions  Medication Sig Dispense Refill  . acetaminophen (TYLENOL) 325 MG tablet Take 650 mg by mouth every 6 (six) hours as needed.     Marland Kitchen albuterol (PROVENTIL HFA;VENTOLIN HFA) 108 (90 BASE) MCG/ACT inhaler Frequency:as needed   Dosage:108 (90 Base)   MCG/ACT  Instructions:ProAir HFA (108 (90 Base)MCG/ACT AERS, Inhalation as needed)  Note:    . albuterol (PROVENTIL) (2.5 MG/3ML) 0.083% nebulizer solution 2.5 mg every 4 (four) hours as needed.     Marland Kitchen CARTIA XT 240 MG 24 hr capsule     . cyclobenzaprine (FLEXERIL) 10 MG tablet TAKE ONE TABLET BY MOUTH THREE  TIMES DAILY AS NEEDED    . Fluticasone-Salmeterol (ADVAIR DISKUS) 500-50 MCG/DOSE AEPB Inhale into the lungs.    . folic acid (FOLVITE) 1 MG tablet Take 1 mg by mouth.    Marland Kitchen ipratropium (ATROVENT) 0.02 % nebulizer solution Inhale 2.5 mL (500 mcg total) by nebulization 3 (three) times a day.    Marland Kitchen LORazepam (ATIVAN) 0.5 MG tablet Take 0.5 mg by mouth.    . oxyCODONE (OXY IR/ROXICODONE) 5 MG immediate release tablet     . pantoprazole (PROTONIX) 40 MG tablet Take 40 mg by mouth.    . thiamine 100 MG tablet Take 100 mg by mouth.    . tiotropium (SPIRIVA) 18 MCG inhalation capsule Place 18 mcg into inhaler and inhale.    . valsartan-hydrochlorothiazide (DIOVAN-HCT) 320-12.5 MG per tablet Take 1 tablet by mouth.    . zolpidem (AMBIEN) 10 MG tablet Take 1 tablet by mouth.     No current facility-administered medications for this visit.    Allergies  Allergen Reactions  . Iodinated Diagnostic Agents Rash  . Latex Rash  . Shellfish-Derived Products Rash    Review of Systems  No shortness of breath Still having some chest pain from chronic chest tube insertion No fever Trying to establish care with his primary care physician  BP 126/85 mmHg  Pulse 62  Resp 20  Ht 5\' 10"  (1.778 m)  Wt 170 lb (77.111 kg)  BMI 24.39 kg/m2  SpO2 98% Physical Exam Alert and comfortable Breath sounds distant but equal Chest tube site closure has healed and skin sutures are removed  Diagnostic Tests: Chest x-ray personally reviewed  Impression: Chest tube site closure has healed-no further followup needed  Plan:patient needs to reestablish with his primary care physician [place] . No return appointment to this office. Patient states he ran out of some prescriptions after discharge from the hospital he was given prescription for Lortab when necessary chest tube site pain, Diovan for his blood pressure and albuterol MDI inhaler when necessary tightness or wheezing.   Mikey Bussing, MD [facility] [phone]

## 2015-11-06 IMAGING — CR DG CHEST 2V
2 series · 2 of 2 positions shown · non-contrast
Comparison: 03/06/2015.  Chest CT 02/22/2015

CLINICAL DATA: History pneumothorax.  History chest tube

EXAM:
CHEST  2 VIEW

[w chest pa]
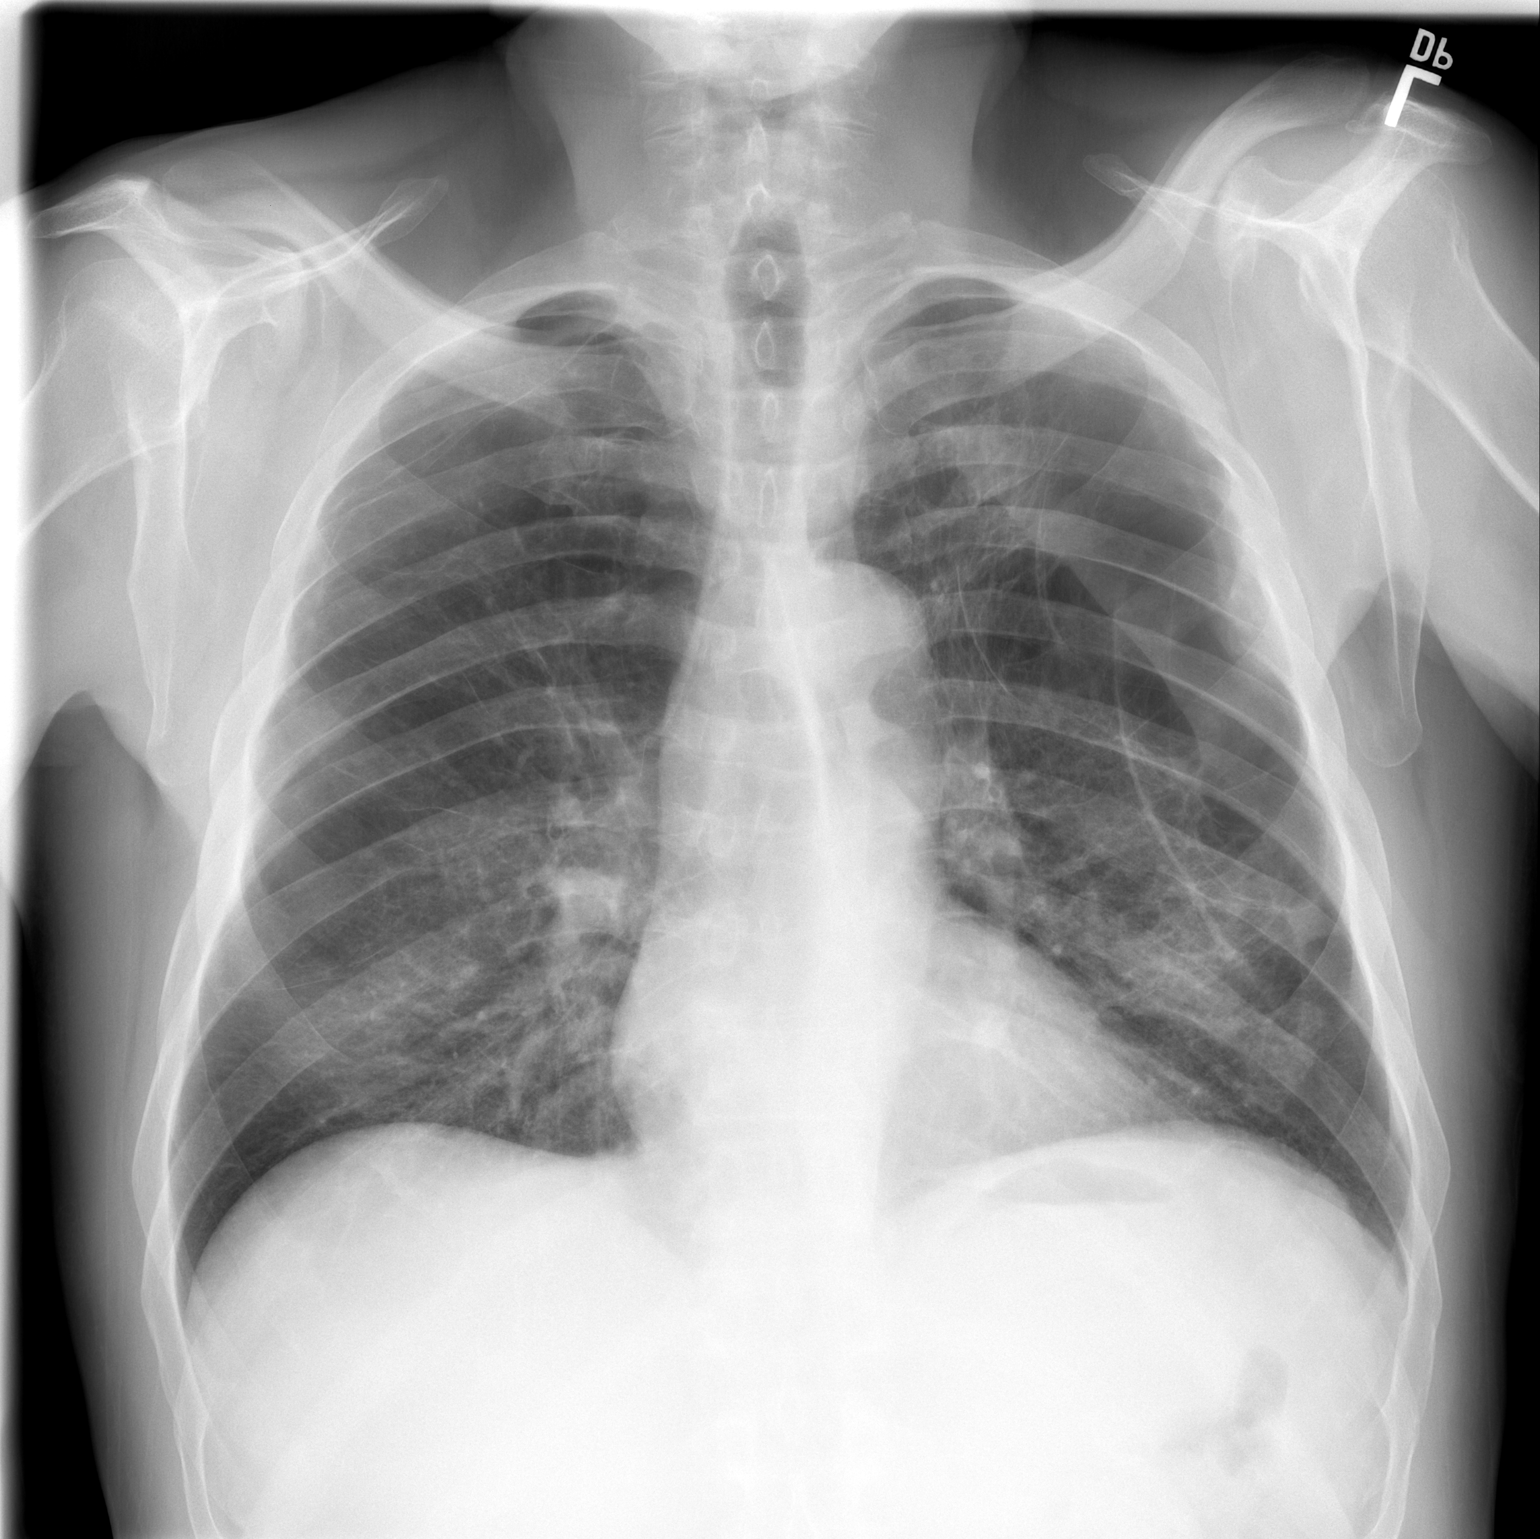

[w chest lat]
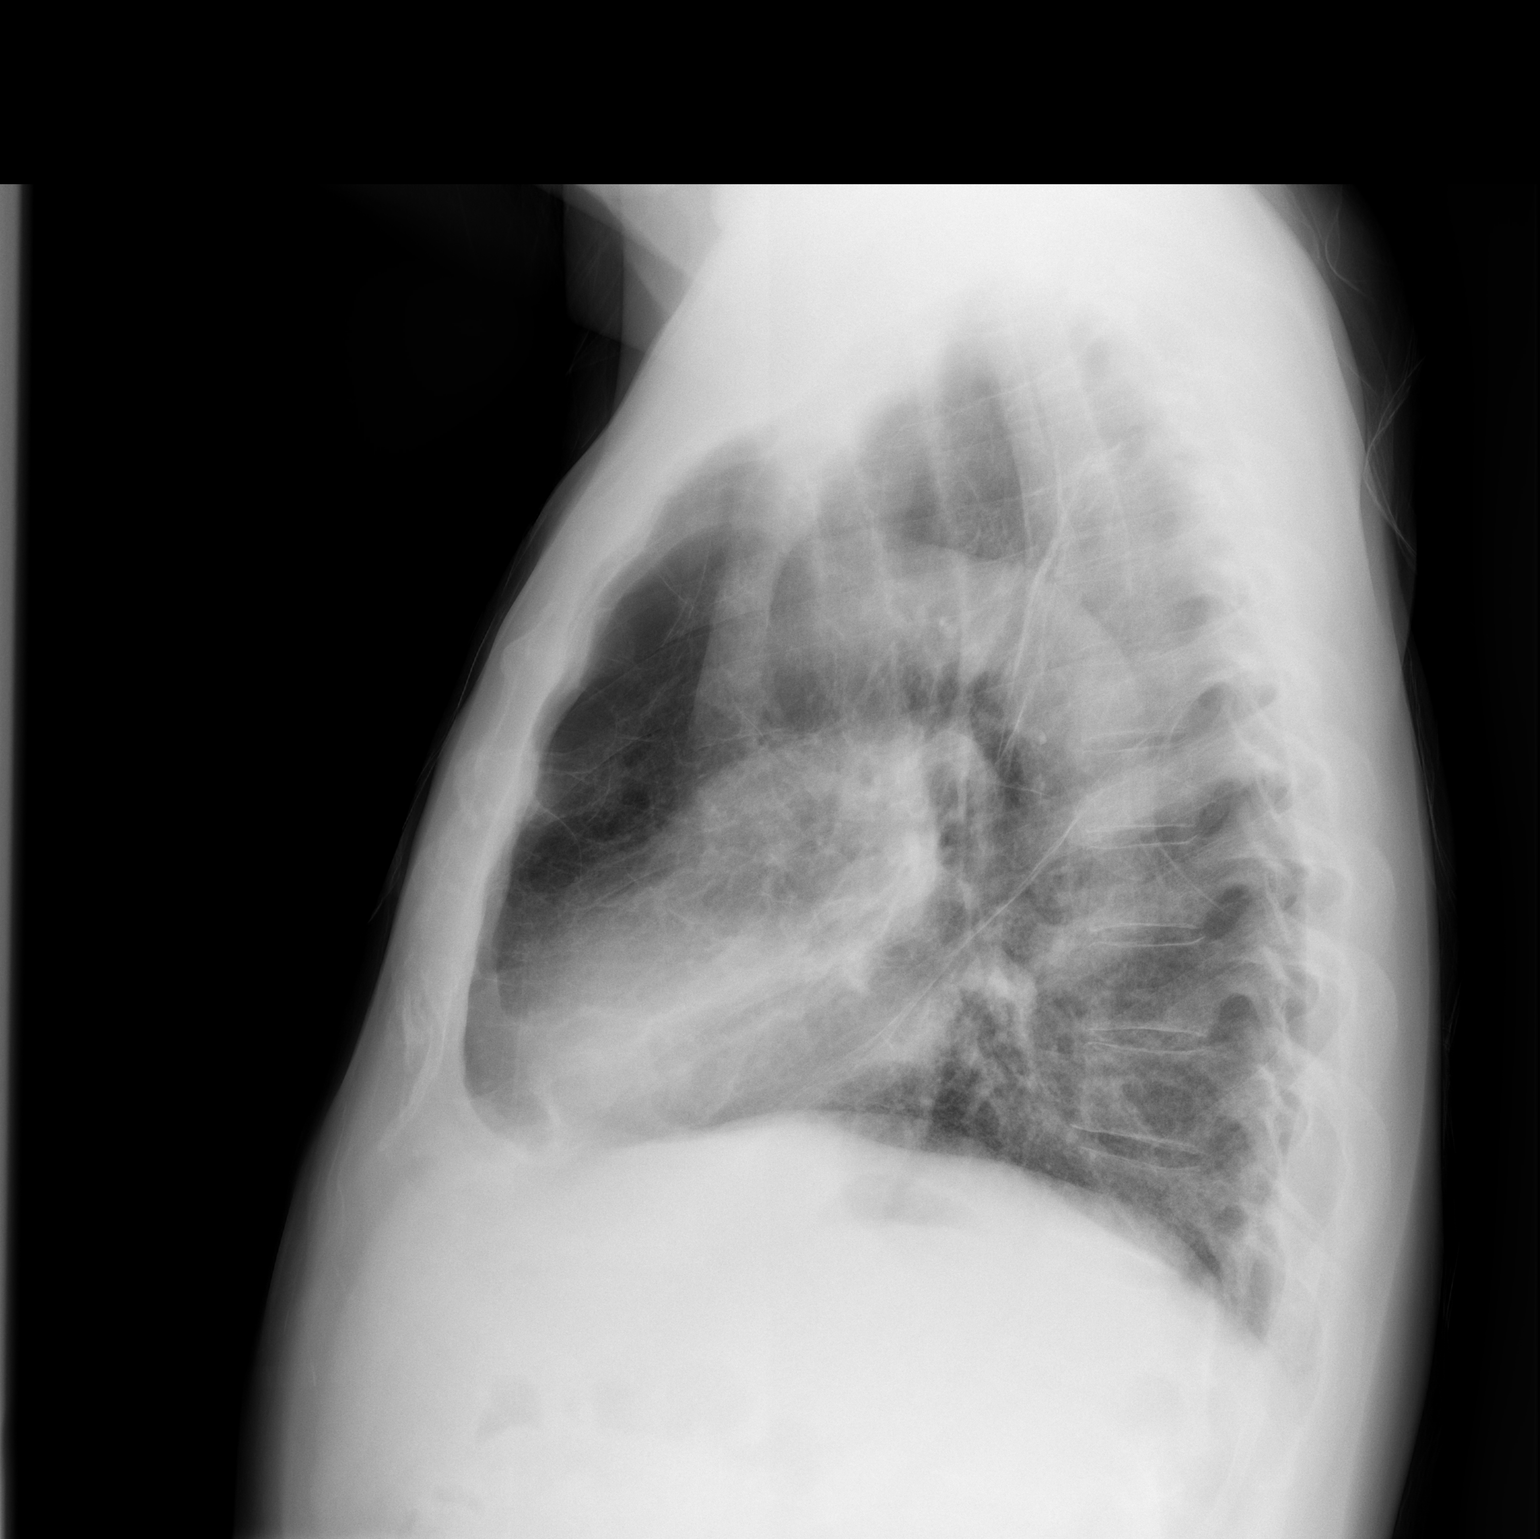

[2 of 2 positions shown; findings below may reference images not displayed]

FINDINGS: Severe bullous emphysema with large bulla left greater than right.
No pneumothorax. No chest tube. Negative for pleural effusion

Patchy infiltrate in the lingula was not present previously and
could represent atelectasis or pneumonia. Right lung free of
infiltrate.
IMPRESSION: Severe bullous emphysema.  No pneumothorax

Lingular infiltrate may represent pneumonia or atelectasis.

## 2016-05-23 DEATH — deceased
